# Patient Record
Sex: Female | Born: 1960 | Race: White | Hispanic: No
Health system: Southern US, Community
[De-identification: ages and names within clinical notes are randomized; demographics above are authoritative.]

---

## 1998-05-04 ENCOUNTER — Other Ambulatory Visit: Admission: RE | Admit: 1998-05-04 | Discharge: 1998-05-04 | Payer: Self-pay | Admitting: Obstetrics & Gynecology

## 1999-09-02 ENCOUNTER — Other Ambulatory Visit: Admission: RE | Admit: 1999-09-02 | Discharge: 1999-09-02 | Payer: Self-pay | Admitting: Obstetrics & Gynecology

## 2000-11-14 ENCOUNTER — Other Ambulatory Visit: Admission: RE | Admit: 2000-11-14 | Discharge: 2000-11-14 | Payer: Self-pay | Admitting: Obstetrics & Gynecology

## 2001-02-01 ENCOUNTER — Ambulatory Visit (HOSPITAL_COMMUNITY): Admission: RE | Admit: 2001-02-01 | Discharge: 2001-02-01 | Payer: Self-pay | Admitting: *Deleted

## 2001-02-01 ENCOUNTER — Encounter (INDEPENDENT_AMBULATORY_CARE_PROVIDER_SITE_OTHER): Payer: Self-pay | Admitting: Specialist

## 2002-01-10 ENCOUNTER — Other Ambulatory Visit: Admission: RE | Admit: 2002-01-10 | Discharge: 2002-01-10 | Payer: Self-pay | Admitting: Obstetrics & Gynecology

## 2003-01-16 ENCOUNTER — Other Ambulatory Visit: Admission: RE | Admit: 2003-01-16 | Discharge: 2003-01-16 | Payer: Self-pay | Admitting: Obstetrics & Gynecology

## 2004-03-16 ENCOUNTER — Other Ambulatory Visit: Admission: RE | Admit: 2004-03-16 | Discharge: 2004-03-16 | Payer: Self-pay | Admitting: Obstetrics & Gynecology

## 2005-04-13 ENCOUNTER — Other Ambulatory Visit: Admission: RE | Admit: 2005-04-13 | Discharge: 2005-04-13 | Payer: Self-pay | Admitting: Obstetrics & Gynecology

## 2007-05-10 ENCOUNTER — Encounter: Admission: RE | Admit: 2007-05-10 | Discharge: 2007-05-10 | Payer: Self-pay | Admitting: Obstetrics & Gynecology

## 2008-05-19 ENCOUNTER — Encounter: Admission: RE | Admit: 2008-05-19 | Discharge: 2008-05-19 | Payer: Self-pay | Admitting: Obstetrics & Gynecology

## 2009-05-20 ENCOUNTER — Encounter: Admission: RE | Admit: 2009-05-20 | Discharge: 2009-05-20 | Payer: Self-pay | Admitting: Obstetrics & Gynecology

## 2010-05-21 ENCOUNTER — Encounter
Admission: RE | Admit: 2010-05-21 | Discharge: 2010-05-21 | Payer: Self-pay | Source: Home / Self Care | Attending: Obstetrics & Gynecology | Admitting: Obstetrics & Gynecology

## 2011-04-20 ENCOUNTER — Other Ambulatory Visit: Payer: Self-pay | Admitting: Obstetrics & Gynecology

## 2011-04-20 DIAGNOSIS — Z1231 Encounter for screening mammogram for malignant neoplasm of breast: Secondary | ICD-10-CM

## 2011-05-25 ENCOUNTER — Ambulatory Visit
Admission: RE | Admit: 2011-05-25 | Discharge: 2011-05-25 | Disposition: A | Discharge status: Home / Self Care | Payer: BC Managed Care – PPO | Source: Ambulatory Visit | Attending: Obstetrics & Gynecology | Admitting: Obstetrics & Gynecology

## 2011-05-25 DIAGNOSIS — Z1231 Encounter for screening mammogram for malignant neoplasm of breast: Secondary | ICD-10-CM

## 2012-05-01 ENCOUNTER — Other Ambulatory Visit: Payer: Self-pay | Admitting: Obstetrics & Gynecology

## 2012-05-01 DIAGNOSIS — Z1231 Encounter for screening mammogram for malignant neoplasm of breast: Secondary | ICD-10-CM

## 2012-06-07 ENCOUNTER — Ambulatory Visit
Admission: RE | Admit: 2012-06-07 | Discharge: 2012-06-07 | Disposition: A | Discharge status: Home / Self Care | Payer: BC Managed Care – PPO | Source: Ambulatory Visit | Attending: Obstetrics & Gynecology | Admitting: Obstetrics & Gynecology

## 2012-06-07 DIAGNOSIS — Z1231 Encounter for screening mammogram for malignant neoplasm of breast: Secondary | ICD-10-CM

## 2012-06-12 ENCOUNTER — Other Ambulatory Visit: Payer: Self-pay | Admitting: Obstetrics & Gynecology

## 2012-06-12 DIAGNOSIS — R928 Other abnormal and inconclusive findings on diagnostic imaging of breast: Secondary | ICD-10-CM

## 2012-06-13 ENCOUNTER — Ambulatory Visit
Admission: RE | Admit: 2012-06-13 | Discharge: 2012-06-13 | Disposition: A | Discharge status: Home / Self Care | Payer: BC Managed Care – PPO | Source: Ambulatory Visit | Attending: Obstetrics & Gynecology | Admitting: Obstetrics & Gynecology

## 2012-06-13 DIAGNOSIS — R928 Other abnormal and inconclusive findings on diagnostic imaging of breast: Secondary | ICD-10-CM

## 2012-11-20 ENCOUNTER — Other Ambulatory Visit: Payer: Self-pay | Admitting: Obstetrics & Gynecology

## 2012-11-20 DIAGNOSIS — N6310 Unspecified lump in the right breast, unspecified quadrant: Secondary | ICD-10-CM

## 2012-12-12 ENCOUNTER — Ambulatory Visit
Admission: RE | Admit: 2012-12-12 | Discharge: 2012-12-12 | Disposition: A | Discharge status: Home / Self Care | Payer: BC Managed Care – PPO | Source: Ambulatory Visit | Attending: Obstetrics & Gynecology | Admitting: Obstetrics & Gynecology

## 2012-12-12 DIAGNOSIS — N6310 Unspecified lump in the right breast, unspecified quadrant: Secondary | ICD-10-CM

## 2013-05-07 ENCOUNTER — Other Ambulatory Visit: Payer: Self-pay | Admitting: Obstetrics & Gynecology

## 2013-05-07 DIAGNOSIS — N6310 Unspecified lump in the right breast, unspecified quadrant: Secondary | ICD-10-CM

## 2013-06-14 ENCOUNTER — Ambulatory Visit
Admission: RE | Admit: 2013-06-14 | Discharge: 2013-06-14 | Disposition: A | Discharge status: Home / Self Care | Payer: BC Managed Care – PPO | Source: Ambulatory Visit | Attending: Obstetrics & Gynecology | Admitting: Obstetrics & Gynecology

## 2013-06-14 DIAGNOSIS — N6310 Unspecified lump in the right breast, unspecified quadrant: Secondary | ICD-10-CM

## 2014-06-03 ENCOUNTER — Other Ambulatory Visit: Payer: Self-pay

## 2014-06-03 DIAGNOSIS — Z1231 Encounter for screening mammogram for malignant neoplasm of breast: Secondary | ICD-10-CM

## 2014-06-17 ENCOUNTER — Ambulatory Visit
Admission: RE | Admit: 2014-06-17 | Discharge: 2014-06-17 | Disposition: A | Discharge status: Home / Self Care | Payer: BLUE CROSS/BLUE SHIELD | Source: Ambulatory Visit

## 2014-06-17 DIAGNOSIS — Z1231 Encounter for screening mammogram for malignant neoplasm of breast: Secondary | ICD-10-CM

## 2014-06-18 ENCOUNTER — Other Ambulatory Visit: Payer: Self-pay | Admitting: Obstetrics & Gynecology

## 2014-06-18 DIAGNOSIS — R928 Other abnormal and inconclusive findings on diagnostic imaging of breast: Secondary | ICD-10-CM

## 2014-06-23 ENCOUNTER — Ambulatory Visit
Admission: RE | Admit: 2014-06-23 | Discharge: 2014-06-23 | Disposition: A | Discharge status: Home / Self Care | Payer: BLUE CROSS/BLUE SHIELD | Source: Ambulatory Visit | Attending: Obstetrics & Gynecology | Admitting: Obstetrics & Gynecology

## 2014-06-23 DIAGNOSIS — R928 Other abnormal and inconclusive findings on diagnostic imaging of breast: Secondary | ICD-10-CM

## 2014-06-25 ENCOUNTER — Other Ambulatory Visit: Payer: BLUE CROSS/BLUE SHIELD

## 2014-11-14 ENCOUNTER — Encounter (INDEPENDENT_AMBULATORY_CARE_PROVIDER_SITE_OTHER): Payer: BLUE CROSS/BLUE SHIELD

## 2014-11-14 DIAGNOSIS — R002 Palpitations: Secondary | ICD-10-CM | POA: Diagnosis not present

## 2014-11-19 ENCOUNTER — Telehealth: Payer: Self-pay | Admitting: Interventional Cardiology

## 2014-11-19 NOTE — Telephone Encounter (Signed)
Juliyana Nebel Maslanka  Holter Monitor 48hr HU and Amil Amen  Order# [id number]   Reading physician: Corky Crafts, MD  Ordering physician: Ileana Ladd, MD  Study date: 11/14/14         Patient Information     Name MRN Description    MEGANNE KUBA [id number] 54 year old Female        Study Information     Physician Technologist Supporting Staff     Katrina N Bowman              Vitals     Height Weight BMI (Calculated)              Interpretation Summary      NSR with PVCs and PACs. No sustained pathologic arrhythmias.   Spoke with pt and informed her of holter results. Pt verbalized understanding.

## 2014-11-21 ENCOUNTER — Other Ambulatory Visit: Payer: Self-pay | Admitting: Obstetrics & Gynecology

## 2014-11-21 DIAGNOSIS — N6489 Other specified disorders of breast: Secondary | ICD-10-CM

## 2014-11-25 ENCOUNTER — Other Ambulatory Visit: Payer: Self-pay | Admitting: Obstetrics & Gynecology

## 2014-11-25 DIAGNOSIS — R928 Other abnormal and inconclusive findings on diagnostic imaging of breast: Secondary | ICD-10-CM

## 2014-12-23 ENCOUNTER — Ambulatory Visit
Admission: RE | Admit: 2014-12-23 | Discharge: 2014-12-23 | Disposition: A | Discharge status: Home / Self Care | Payer: BLUE CROSS/BLUE SHIELD | Source: Ambulatory Visit | Attending: Obstetrics & Gynecology | Admitting: Obstetrics & Gynecology

## 2014-12-23 DIAGNOSIS — R928 Other abnormal and inconclusive findings on diagnostic imaging of breast: Secondary | ICD-10-CM

## 2014-12-23 DIAGNOSIS — N6489 Other specified disorders of breast: Secondary | ICD-10-CM

## 2015-06-03 ENCOUNTER — Other Ambulatory Visit: Payer: Self-pay

## 2015-06-03 DIAGNOSIS — Z1231 Encounter for screening mammogram for malignant neoplasm of breast: Secondary | ICD-10-CM

## 2015-06-25 ENCOUNTER — Ambulatory Visit
Admission: RE | Admit: 2015-06-25 | Discharge: 2015-06-25 | Disposition: A | Discharge status: Home / Self Care | Payer: BLUE CROSS/BLUE SHIELD | Source: Ambulatory Visit | Attending: Obstetrics & Gynecology | Admitting: Obstetrics & Gynecology

## 2015-06-25 ENCOUNTER — Other Ambulatory Visit: Payer: Self-pay | Admitting: Obstetrics & Gynecology

## 2015-06-25 ENCOUNTER — Ambulatory Visit
Admission: RE | Admit: 2015-06-25 | Discharge: 2015-06-25 | Disposition: A | Discharge status: Home / Self Care | Payer: BLUE CROSS/BLUE SHIELD | Source: Ambulatory Visit

## 2015-06-25 DIAGNOSIS — Z1231 Encounter for screening mammogram for malignant neoplasm of breast: Secondary | ICD-10-CM

## 2015-06-25 DIAGNOSIS — N644 Mastodynia: Secondary | ICD-10-CM

## 2015-09-22 DIAGNOSIS — Z23 Encounter for immunization: Secondary | ICD-10-CM | POA: Diagnosis not present

## 2015-09-22 DIAGNOSIS — Z13220 Encounter for screening for lipoid disorders: Secondary | ICD-10-CM | POA: Diagnosis not present

## 2015-09-22 DIAGNOSIS — Z0000 Encounter for general adult medical examination without abnormal findings: Secondary | ICD-10-CM | POA: Diagnosis not present

## 2015-09-22 DIAGNOSIS — D1801 Hemangioma of skin and subcutaneous tissue: Secondary | ICD-10-CM | POA: Diagnosis not present

## 2015-09-22 DIAGNOSIS — Z130 Encounter for screening for diseases of the blood and blood-forming organs and certain disorders involving the immune mechanism: Secondary | ICD-10-CM | POA: Diagnosis not present

## 2015-10-27 DIAGNOSIS — K5730 Diverticulosis of large intestine without perforation or abscess without bleeding: Secondary | ICD-10-CM | POA: Diagnosis not present

## 2015-10-27 DIAGNOSIS — Z1211 Encounter for screening for malignant neoplasm of colon: Secondary | ICD-10-CM | POA: Diagnosis not present

## 2015-10-27 DIAGNOSIS — D120 Benign neoplasm of cecum: Secondary | ICD-10-CM | POA: Diagnosis not present

## 2015-10-27 DIAGNOSIS — K635 Polyp of colon: Secondary | ICD-10-CM | POA: Diagnosis not present

## 2015-12-28 DIAGNOSIS — Z124 Encounter for screening for malignant neoplasm of cervix: Secondary | ICD-10-CM | POA: Diagnosis not present

## 2015-12-28 DIAGNOSIS — Z6827 Body mass index (BMI) 27.0-27.9, adult: Secondary | ICD-10-CM | POA: Diagnosis not present

## 2015-12-28 DIAGNOSIS — Z01419 Encounter for gynecological examination (general) (routine) without abnormal findings: Secondary | ICD-10-CM | POA: Diagnosis not present

## 2016-03-24 DIAGNOSIS — E7800 Pure hypercholesterolemia, unspecified: Secondary | ICD-10-CM | POA: Diagnosis not present

## 2016-03-24 DIAGNOSIS — F410 Panic disorder [episodic paroxysmal anxiety] without agoraphobia: Secondary | ICD-10-CM | POA: Diagnosis not present

## 2016-03-24 DIAGNOSIS — I10 Essential (primary) hypertension: Secondary | ICD-10-CM | POA: Diagnosis not present

## 2016-06-01 ENCOUNTER — Other Ambulatory Visit: Payer: Self-pay | Admitting: Family Medicine

## 2016-06-01 DIAGNOSIS — Z1231 Encounter for screening mammogram for malignant neoplasm of breast: Secondary | ICD-10-CM

## 2016-06-27 ENCOUNTER — Ambulatory Visit
Admission: RE | Admit: 2016-06-27 | Discharge: 2016-06-27 | Disposition: A | Discharge status: Home / Self Care | Payer: BLUE CROSS/BLUE SHIELD | Source: Ambulatory Visit | Attending: Family Medicine | Admitting: Family Medicine

## 2016-06-27 DIAGNOSIS — Z1231 Encounter for screening mammogram for malignant neoplasm of breast: Secondary | ICD-10-CM | POA: Diagnosis not present

## 2016-07-29 DIAGNOSIS — Z713 Dietary counseling and surveillance: Secondary | ICD-10-CM | POA: Diagnosis not present

## 2016-08-08 DIAGNOSIS — L249 Irritant contact dermatitis, unspecified cause: Secondary | ICD-10-CM | POA: Diagnosis not present

## 2016-08-19 DIAGNOSIS — Z713 Dietary counseling and surveillance: Secondary | ICD-10-CM | POA: Diagnosis not present

## 2016-09-16 DIAGNOSIS — Z713 Dietary counseling and surveillance: Secondary | ICD-10-CM | POA: Diagnosis not present

## 2016-09-26 DIAGNOSIS — Z130 Encounter for screening for diseases of the blood and blood-forming organs and certain disorders involving the immune mechanism: Secondary | ICD-10-CM | POA: Diagnosis not present

## 2016-09-26 DIAGNOSIS — Z13220 Encounter for screening for lipoid disorders: Secondary | ICD-10-CM | POA: Diagnosis not present

## 2016-09-26 DIAGNOSIS — Z0000 Encounter for general adult medical examination without abnormal findings: Secondary | ICD-10-CM | POA: Diagnosis not present

## 2016-10-27 DIAGNOSIS — D2271 Melanocytic nevi of right lower limb, including hip: Secondary | ICD-10-CM | POA: Diagnosis not present

## 2016-10-27 DIAGNOSIS — D1801 Hemangioma of skin and subcutaneous tissue: Secondary | ICD-10-CM | POA: Diagnosis not present

## 2016-10-27 DIAGNOSIS — D2272 Melanocytic nevi of left lower limb, including hip: Secondary | ICD-10-CM | POA: Diagnosis not present

## 2016-10-27 DIAGNOSIS — D225 Melanocytic nevi of trunk: Secondary | ICD-10-CM | POA: Diagnosis not present

## 2017-04-03 DIAGNOSIS — Z6826 Body mass index (BMI) 26.0-26.9, adult: Secondary | ICD-10-CM | POA: Diagnosis not present

## 2017-04-03 DIAGNOSIS — Z01419 Encounter for gynecological examination (general) (routine) without abnormal findings: Secondary | ICD-10-CM | POA: Diagnosis not present

## 2017-06-01 ENCOUNTER — Other Ambulatory Visit: Payer: Self-pay | Admitting: Family Medicine

## 2017-06-01 DIAGNOSIS — Z1231 Encounter for screening mammogram for malignant neoplasm of breast: Secondary | ICD-10-CM

## 2017-06-20 DIAGNOSIS — F411 Generalized anxiety disorder: Secondary | ICD-10-CM | POA: Diagnosis not present

## 2017-06-29 ENCOUNTER — Ambulatory Visit
Admission: RE | Admit: 2017-06-29 | Discharge: 2017-06-29 | Disposition: A | Discharge status: Home / Self Care | Payer: BLUE CROSS/BLUE SHIELD | Source: Ambulatory Visit | Attending: Family Medicine | Admitting: Family Medicine

## 2017-06-29 ENCOUNTER — Encounter: Payer: Self-pay | Admitting: Radiology

## 2017-06-29 DIAGNOSIS — Z1231 Encounter for screening mammogram for malignant neoplasm of breast: Secondary | ICD-10-CM

## 2017-10-02 DIAGNOSIS — R030 Elevated blood-pressure reading, without diagnosis of hypertension: Secondary | ICD-10-CM | POA: Diagnosis not present

## 2017-10-02 DIAGNOSIS — Z0000 Encounter for general adult medical examination without abnormal findings: Secondary | ICD-10-CM | POA: Diagnosis not present

## 2017-10-02 DIAGNOSIS — F410 Panic disorder [episodic paroxysmal anxiety] without agoraphobia: Secondary | ICD-10-CM | POA: Diagnosis not present

## 2017-10-02 DIAGNOSIS — Z13220 Encounter for screening for lipoid disorders: Secondary | ICD-10-CM | POA: Diagnosis not present

## 2017-10-02 DIAGNOSIS — Z130 Encounter for screening for diseases of the blood and blood-forming organs and certain disorders involving the immune mechanism: Secondary | ICD-10-CM | POA: Diagnosis not present

## 2017-10-10 DIAGNOSIS — S92504A Nondisplaced unspecified fracture of right lesser toe(s), initial encounter for closed fracture: Secondary | ICD-10-CM | POA: Diagnosis not present

## 2018-06-19 ENCOUNTER — Other Ambulatory Visit: Payer: Self-pay | Admitting: Family Medicine

## 2018-06-19 DIAGNOSIS — Z1231 Encounter for screening mammogram for malignant neoplasm of breast: Secondary | ICD-10-CM

## 2018-07-19 ENCOUNTER — Ambulatory Visit
Admission: RE | Admit: 2018-07-19 | Discharge: 2018-07-19 | Disposition: A | Discharge status: Home / Self Care | Payer: BLUE CROSS/BLUE SHIELD | Source: Ambulatory Visit | Attending: Family Medicine | Admitting: Family Medicine

## 2018-07-19 DIAGNOSIS — Z1231 Encounter for screening mammogram for malignant neoplasm of breast: Secondary | ICD-10-CM | POA: Diagnosis not present

## 2018-07-23 DIAGNOSIS — L986 Other infiltrative disorders of the skin and subcutaneous tissue: Secondary | ICD-10-CM | POA: Diagnosis not present

## 2018-07-23 DIAGNOSIS — L821 Other seborrheic keratosis: Secondary | ICD-10-CM | POA: Diagnosis not present

## 2018-07-23 DIAGNOSIS — D1801 Hemangioma of skin and subcutaneous tissue: Secondary | ICD-10-CM | POA: Diagnosis not present

## 2018-07-23 DIAGNOSIS — Z85828 Personal history of other malignant neoplasm of skin: Secondary | ICD-10-CM | POA: Diagnosis not present

## 2018-07-23 DIAGNOSIS — L570 Actinic keratosis: Secondary | ICD-10-CM | POA: Diagnosis not present

## 2018-07-23 DIAGNOSIS — D485 Neoplasm of uncertain behavior of skin: Secondary | ICD-10-CM | POA: Diagnosis not present

## 2018-07-23 DIAGNOSIS — D225 Melanocytic nevi of trunk: Secondary | ICD-10-CM | POA: Diagnosis not present

## 2019-03-19 DIAGNOSIS — Z0000 Encounter for general adult medical examination without abnormal findings: Secondary | ICD-10-CM | POA: Diagnosis not present

## 2019-03-19 DIAGNOSIS — E7800 Pure hypercholesterolemia, unspecified: Secondary | ICD-10-CM | POA: Diagnosis not present

## 2019-03-19 DIAGNOSIS — Z23 Encounter for immunization: Secondary | ICD-10-CM | POA: Diagnosis not present

## 2019-03-19 DIAGNOSIS — Z130 Encounter for screening for diseases of the blood and blood-forming organs and certain disorders involving the immune mechanism: Secondary | ICD-10-CM | POA: Diagnosis not present

## 2019-05-07 DIAGNOSIS — Z6831 Body mass index (BMI) 31.0-31.9, adult: Secondary | ICD-10-CM | POA: Diagnosis not present

## 2019-05-07 DIAGNOSIS — Z124 Encounter for screening for malignant neoplasm of cervix: Secondary | ICD-10-CM | POA: Diagnosis not present

## 2019-05-07 DIAGNOSIS — Z01419 Encounter for gynecological examination (general) (routine) without abnormal findings: Secondary | ICD-10-CM | POA: Diagnosis not present

## 2019-06-10 ENCOUNTER — Other Ambulatory Visit: Payer: Self-pay | Admitting: Family Medicine

## 2019-06-10 DIAGNOSIS — Z1231 Encounter for screening mammogram for malignant neoplasm of breast: Secondary | ICD-10-CM

## 2019-07-05 DIAGNOSIS — Z23 Encounter for immunization: Secondary | ICD-10-CM | POA: Diagnosis not present

## 2019-07-23 ENCOUNTER — Ambulatory Visit
Admission: RE | Admit: 2019-07-23 | Discharge: 2019-07-23 | Disposition: A | Discharge status: Home / Self Care | Payer: BC Managed Care – PPO | Source: Ambulatory Visit | Attending: Family Medicine | Admitting: Family Medicine

## 2019-07-23 ENCOUNTER — Other Ambulatory Visit: Payer: Self-pay

## 2019-07-23 DIAGNOSIS — Z1231 Encounter for screening mammogram for malignant neoplasm of breast: Secondary | ICD-10-CM

## 2019-07-29 DIAGNOSIS — L821 Other seborrheic keratosis: Secondary | ICD-10-CM | POA: Diagnosis not present

## 2019-07-29 DIAGNOSIS — L814 Other melanin hyperpigmentation: Secondary | ICD-10-CM | POA: Diagnosis not present

## 2019-07-29 DIAGNOSIS — D2262 Melanocytic nevi of left upper limb, including shoulder: Secondary | ICD-10-CM | POA: Diagnosis not present

## 2019-07-29 DIAGNOSIS — D1801 Hemangioma of skin and subcutaneous tissue: Secondary | ICD-10-CM | POA: Diagnosis not present

## 2019-11-07 DIAGNOSIS — R309 Painful micturition, unspecified: Secondary | ICD-10-CM | POA: Diagnosis not present

## 2020-03-31 DIAGNOSIS — Z0000 Encounter for general adult medical examination without abnormal findings: Secondary | ICD-10-CM | POA: Diagnosis not present

## 2020-03-31 DIAGNOSIS — E7800 Pure hypercholesterolemia, unspecified: Secondary | ICD-10-CM | POA: Diagnosis not present

## 2020-03-31 DIAGNOSIS — Z130 Encounter for screening for diseases of the blood and blood-forming organs and certain disorders involving the immune mechanism: Secondary | ICD-10-CM | POA: Diagnosis not present

## 2020-05-19 DIAGNOSIS — R7401 Elevation of levels of liver transaminase levels: Secondary | ICD-10-CM | POA: Diagnosis not present

## 2020-07-14 ENCOUNTER — Other Ambulatory Visit: Payer: Self-pay | Admitting: Family Medicine

## 2020-07-14 DIAGNOSIS — Z1231 Encounter for screening mammogram for malignant neoplasm of breast: Secondary | ICD-10-CM

## 2020-07-27 DIAGNOSIS — D225 Melanocytic nevi of trunk: Secondary | ICD-10-CM | POA: Diagnosis not present

## 2020-07-27 DIAGNOSIS — L814 Other melanin hyperpigmentation: Secondary | ICD-10-CM | POA: Diagnosis not present

## 2020-07-27 DIAGNOSIS — Z85828 Personal history of other malignant neoplasm of skin: Secondary | ICD-10-CM | POA: Diagnosis not present

## 2020-07-27 DIAGNOSIS — L821 Other seborrheic keratosis: Secondary | ICD-10-CM | POA: Diagnosis not present

## 2020-09-02 ENCOUNTER — Other Ambulatory Visit: Payer: Self-pay

## 2020-09-02 ENCOUNTER — Ambulatory Visit
Admission: RE | Admit: 2020-09-02 | Discharge: 2020-09-02 | Disposition: A | Discharge status: Home / Self Care | Payer: BC Managed Care – PPO | Source: Ambulatory Visit | Attending: Family Medicine | Admitting: Family Medicine

## 2020-09-02 DIAGNOSIS — Z1231 Encounter for screening mammogram for malignant neoplasm of breast: Secondary | ICD-10-CM

## 2021-04-12 DIAGNOSIS — R7401 Elevation of levels of liver transaminase levels: Secondary | ICD-10-CM | POA: Diagnosis not present

## 2021-04-12 DIAGNOSIS — F411 Generalized anxiety disorder: Secondary | ICD-10-CM | POA: Diagnosis not present

## 2021-04-12 DIAGNOSIS — E7800 Pure hypercholesterolemia, unspecified: Secondary | ICD-10-CM | POA: Diagnosis not present

## 2021-04-12 DIAGNOSIS — Z130 Encounter for screening for diseases of the blood and blood-forming organs and certain disorders involving the immune mechanism: Secondary | ICD-10-CM | POA: Diagnosis not present

## 2021-04-12 DIAGNOSIS — Z0000 Encounter for general adult medical examination without abnormal findings: Secondary | ICD-10-CM | POA: Diagnosis not present

## 2021-04-29 DIAGNOSIS — R7401 Elevation of levels of liver transaminase levels: Secondary | ICD-10-CM | POA: Diagnosis not present

## 2021-05-06 ENCOUNTER — Other Ambulatory Visit: Payer: Self-pay | Admitting: Family Medicine

## 2021-05-06 DIAGNOSIS — R7401 Elevation of levels of liver transaminase levels: Secondary | ICD-10-CM

## 2021-05-10 DIAGNOSIS — Z124 Encounter for screening for malignant neoplasm of cervix: Secondary | ICD-10-CM | POA: Diagnosis not present

## 2021-05-10 DIAGNOSIS — Z6831 Body mass index (BMI) 31.0-31.9, adult: Secondary | ICD-10-CM | POA: Diagnosis not present

## 2021-05-10 DIAGNOSIS — Z01419 Encounter for gynecological examination (general) (routine) without abnormal findings: Secondary | ICD-10-CM | POA: Diagnosis not present

## 2021-05-11 ENCOUNTER — Ambulatory Visit
Admission: RE | Admit: 2021-05-11 | Discharge: 2021-05-11 | Disposition: A | Discharge status: Home / Self Care | Payer: BC Managed Care – PPO | Source: Ambulatory Visit | Attending: Family Medicine | Admitting: Family Medicine

## 2021-05-11 DIAGNOSIS — K7689 Other specified diseases of liver: Secondary | ICD-10-CM | POA: Diagnosis not present

## 2021-05-11 DIAGNOSIS — R945 Abnormal results of liver function studies: Secondary | ICD-10-CM | POA: Diagnosis not present

## 2021-05-11 DIAGNOSIS — R7401 Elevation of levels of liver transaminase levels: Secondary | ICD-10-CM

## 2021-07-27 DIAGNOSIS — D1801 Hemangioma of skin and subcutaneous tissue: Secondary | ICD-10-CM | POA: Diagnosis not present

## 2021-07-27 DIAGNOSIS — D2272 Melanocytic nevi of left lower limb, including hip: Secondary | ICD-10-CM | POA: Diagnosis not present

## 2021-07-27 DIAGNOSIS — L814 Other melanin hyperpigmentation: Secondary | ICD-10-CM | POA: Diagnosis not present

## 2021-07-27 DIAGNOSIS — L821 Other seborrheic keratosis: Secondary | ICD-10-CM | POA: Diagnosis not present

## 2021-09-13 ENCOUNTER — Other Ambulatory Visit: Payer: Self-pay | Admitting: Family Medicine

## 2021-09-13 DIAGNOSIS — Z1231 Encounter for screening mammogram for malignant neoplasm of breast: Secondary | ICD-10-CM

## 2021-09-14 ENCOUNTER — Ambulatory Visit
Admission: RE | Admit: 2021-09-14 | Discharge: 2021-09-14 | Disposition: A | Discharge status: Home / Self Care | Payer: BC Managed Care – PPO | Source: Ambulatory Visit | Attending: Family Medicine | Admitting: Family Medicine

## 2021-09-14 DIAGNOSIS — Z1231 Encounter for screening mammogram for malignant neoplasm of breast: Secondary | ICD-10-CM | POA: Diagnosis not present

## 2022-03-31 DIAGNOSIS — Z23 Encounter for immunization: Secondary | ICD-10-CM | POA: Diagnosis not present

## 2022-04-26 DIAGNOSIS — Z0000 Encounter for general adult medical examination without abnormal findings: Secondary | ICD-10-CM | POA: Diagnosis not present

## 2022-04-26 DIAGNOSIS — F411 Generalized anxiety disorder: Secondary | ICD-10-CM | POA: Diagnosis not present

## 2022-04-26 DIAGNOSIS — E7800 Pure hypercholesterolemia, unspecified: Secondary | ICD-10-CM | POA: Diagnosis not present

## 2022-07-13 DIAGNOSIS — Z01419 Encounter for gynecological examination (general) (routine) without abnormal findings: Secondary | ICD-10-CM | POA: Diagnosis not present

## 2022-07-13 DIAGNOSIS — Z6832 Body mass index (BMI) 32.0-32.9, adult: Secondary | ICD-10-CM | POA: Diagnosis not present

## 2022-07-27 DIAGNOSIS — L570 Actinic keratosis: Secondary | ICD-10-CM | POA: Diagnosis not present

## 2022-07-27 DIAGNOSIS — L814 Other melanin hyperpigmentation: Secondary | ICD-10-CM | POA: Diagnosis not present

## 2022-07-27 DIAGNOSIS — D225 Melanocytic nevi of trunk: Secondary | ICD-10-CM | POA: Diagnosis not present

## 2022-07-27 DIAGNOSIS — D2272 Melanocytic nevi of left lower limb, including hip: Secondary | ICD-10-CM | POA: Diagnosis not present

## 2022-07-27 DIAGNOSIS — L821 Other seborrheic keratosis: Secondary | ICD-10-CM | POA: Diagnosis not present

## 2022-09-29 ENCOUNTER — Other Ambulatory Visit: Payer: Self-pay | Admitting: Obstetrics & Gynecology

## 2022-09-29 DIAGNOSIS — Z1231 Encounter for screening mammogram for malignant neoplasm of breast: Secondary | ICD-10-CM

## 2022-09-30 ENCOUNTER — Ambulatory Visit
Admission: RE | Admit: 2022-09-30 | Discharge: 2022-09-30 | Disposition: A | Discharge status: Home / Self Care | Payer: BC Managed Care – PPO | Source: Ambulatory Visit | Attending: Obstetrics & Gynecology | Admitting: Obstetrics & Gynecology

## 2022-09-30 DIAGNOSIS — Z1231 Encounter for screening mammogram for malignant neoplasm of breast: Secondary | ICD-10-CM | POA: Diagnosis not present

## 2023-01-14 IMAGING — MG MM DIGITAL SCREENING BILAT W/ TOMO AND CAD
8 series · 8 of 24 positions shown · non-contrast
Comparison: Previous exam(s).

CLINICAL DATA: Screening.

EXAM:
DIGITAL SCREENING BILATERAL MAMMOGRAM WITH TOMOSYNTHESIS AND CAD
TECHNIQUE: Bilateral screening digital craniocaudal and mediolateral oblique
mammograms were obtained. Bilateral screening digital breast
tomosynthesis was performed. The images were evaluated with
computer-aided detection.

[L CC synth-2D]
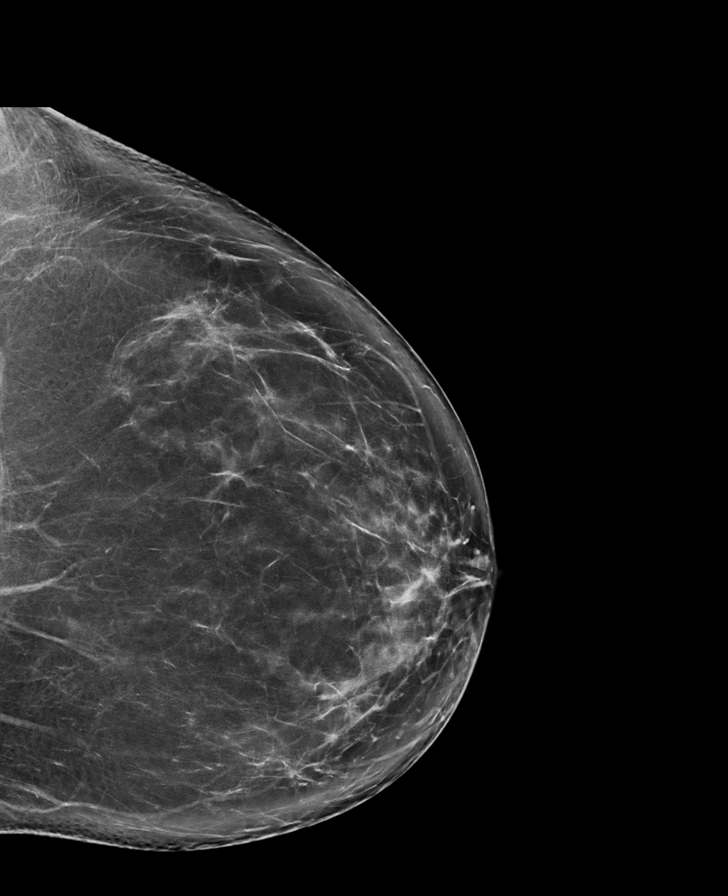

[R CC synth-2D]
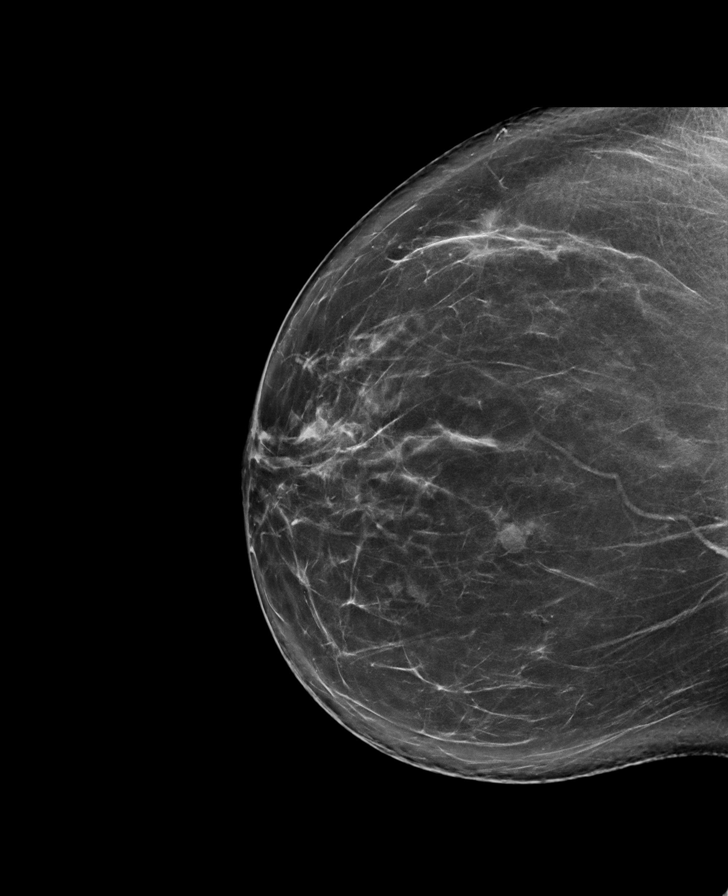

[R MLO synth-2D]
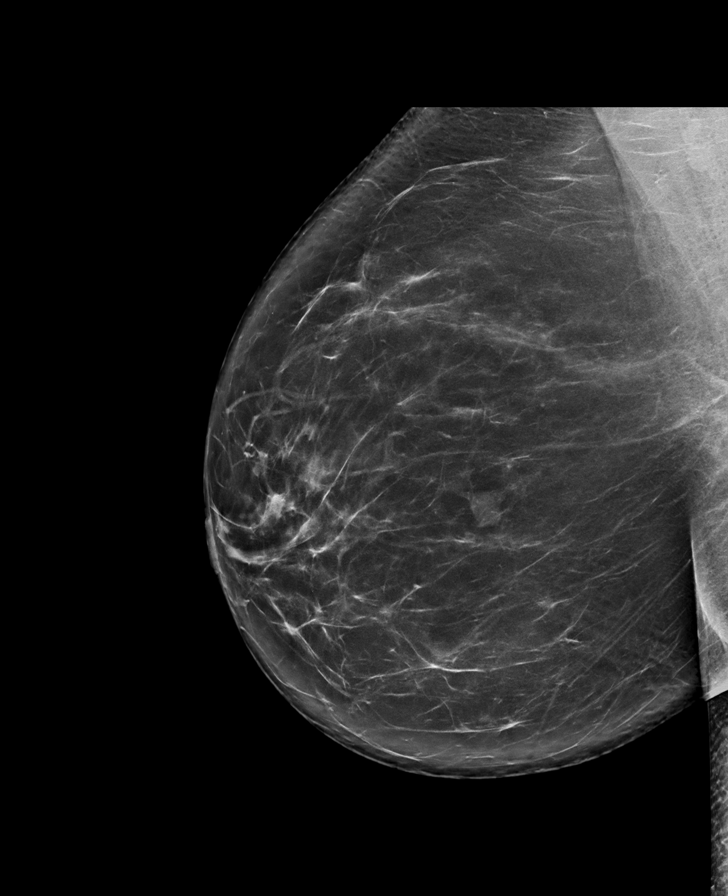

[L MLO synth-2D]
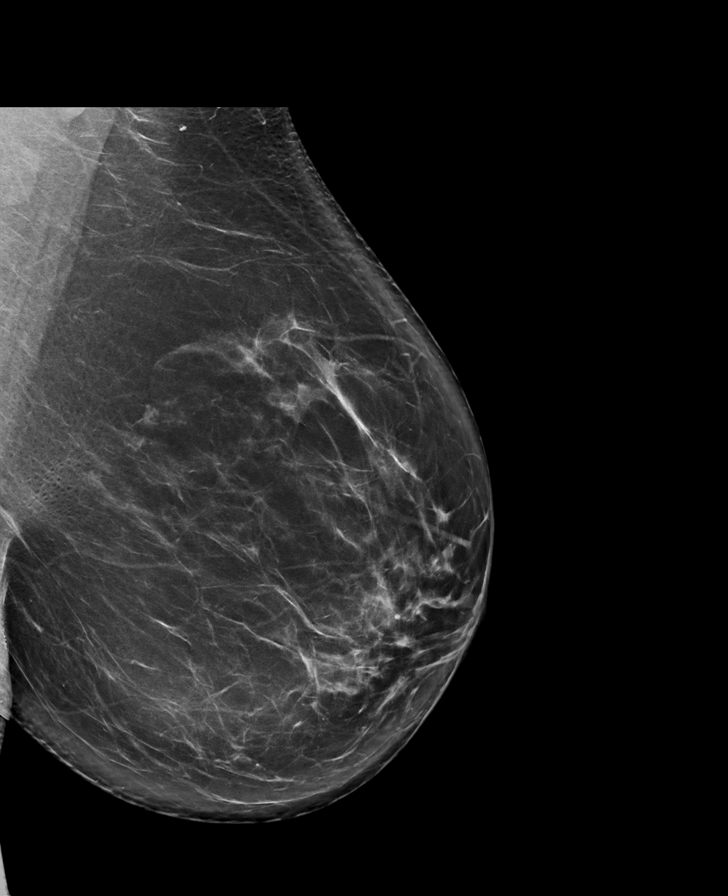

[L CC tomo · tomo slice 45/88.0]
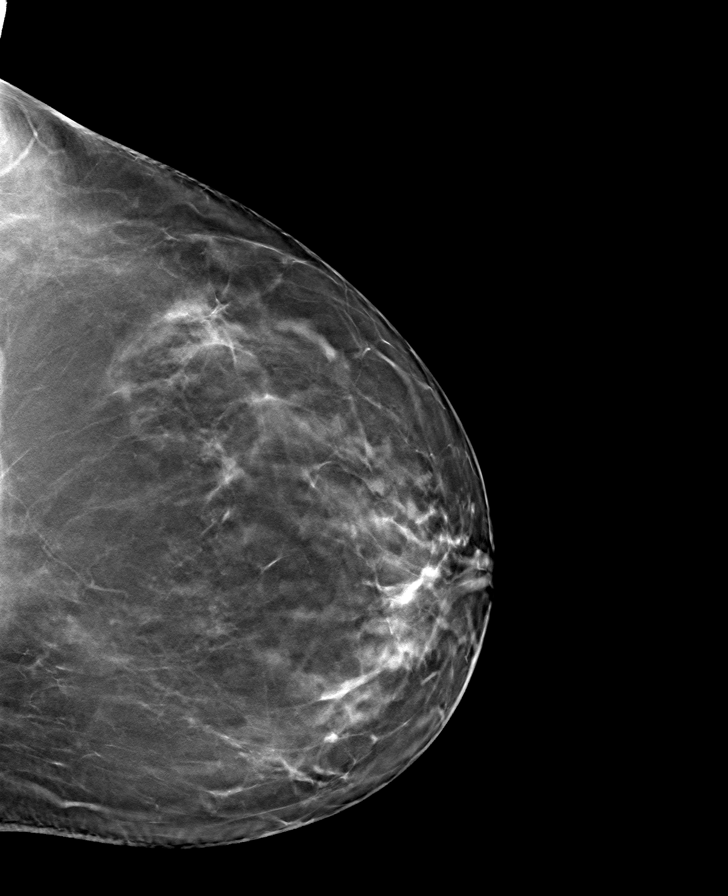

[R MLO tomo · tomo slice 49/96.0]
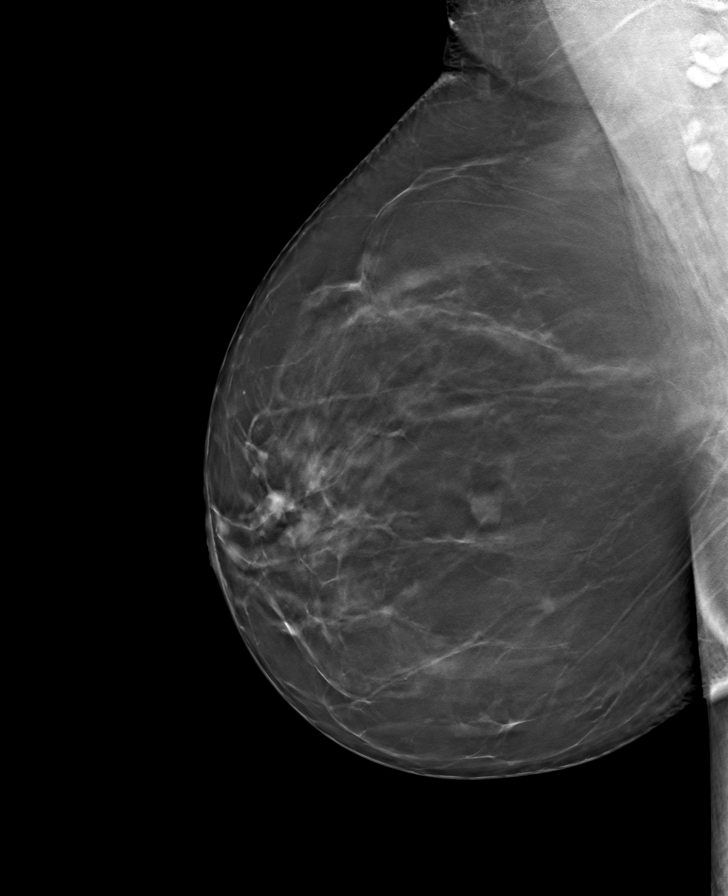

[L MLO tomo · tomo slice 49/97.0]
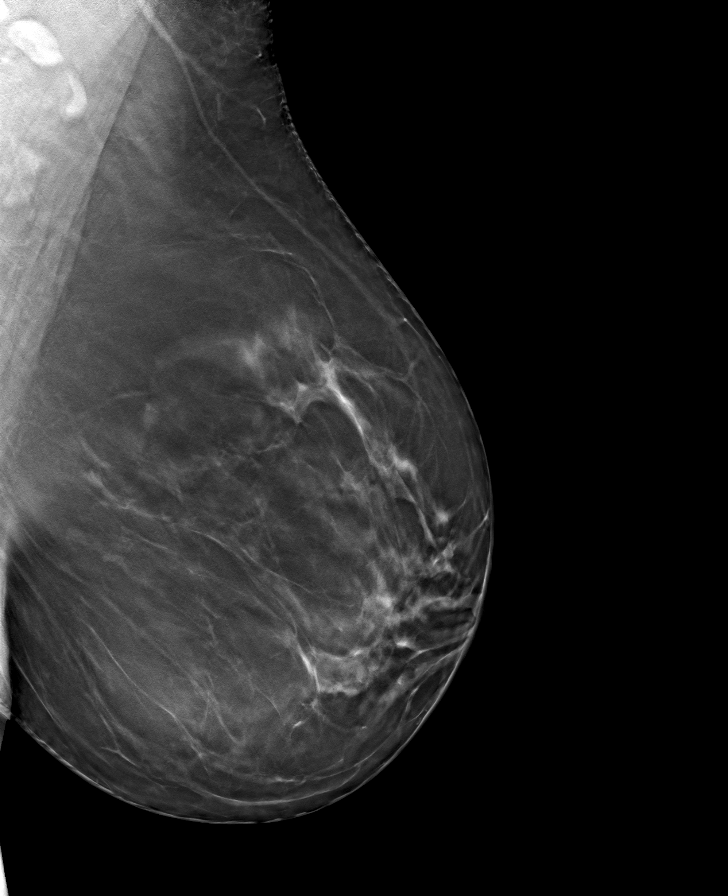

[R CC tomo · tomo slice 45/89.0]
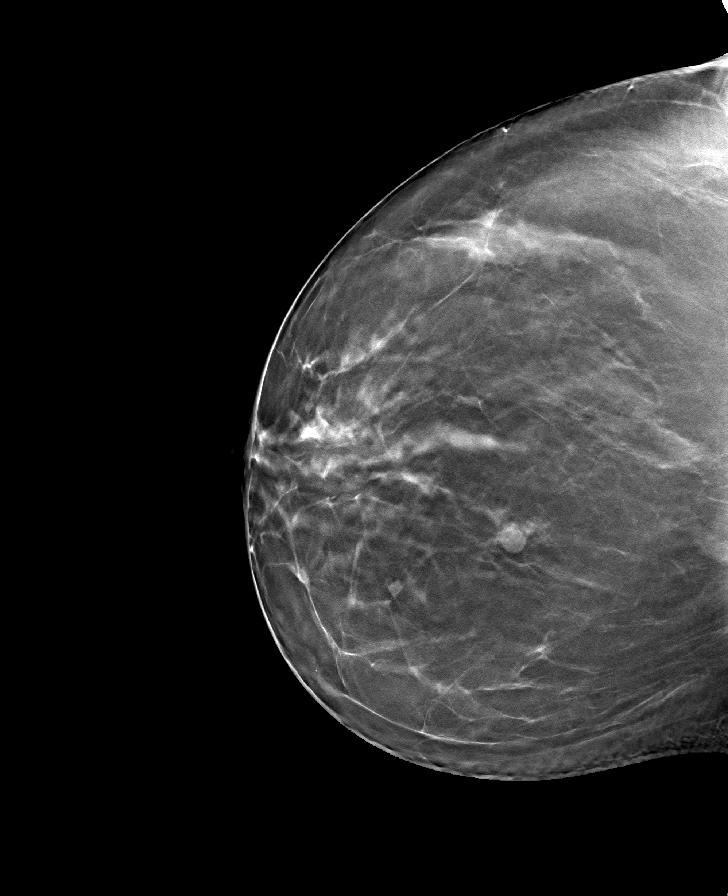

[8 of 24 positions shown; findings below may reference images not displayed]

ACR Breast Density Category b: There are scattered areas of
fibroglandular density.
FINDINGS: There are no findings suspicious for malignancy.
IMPRESSION: No mammographic evidence of malignancy. A result letter of this
screening mammogram will be mailed directly to the patient.

RECOMMENDATION:
Screening mammogram in one year. (Code:51-O-LD2)

BI-RADS CATEGORY  1: Negative.

## 2023-04-06 DIAGNOSIS — Z23 Encounter for immunization: Secondary | ICD-10-CM | POA: Diagnosis not present

## 2023-06-27 ENCOUNTER — Other Ambulatory Visit (HOSPITAL_BASED_OUTPATIENT_CLINIC_OR_DEPARTMENT_OTHER): Payer: Self-pay | Admitting: Family Medicine

## 2023-06-27 DIAGNOSIS — R7401 Elevation of levels of liver transaminase levels: Secondary | ICD-10-CM | POA: Diagnosis not present

## 2023-06-27 DIAGNOSIS — F411 Generalized anxiety disorder: Secondary | ICD-10-CM | POA: Diagnosis not present

## 2023-06-27 DIAGNOSIS — E7800 Pure hypercholesterolemia, unspecified: Secondary | ICD-10-CM

## 2023-06-27 DIAGNOSIS — Z0000 Encounter for general adult medical examination without abnormal findings: Secondary | ICD-10-CM | POA: Diagnosis not present

## 2023-06-30 ENCOUNTER — Ambulatory Visit (HOSPITAL_COMMUNITY)
Admission: RE | Admit: 2023-06-30 | Discharge: 2023-06-30 | Disposition: A | Discharge status: Home / Self Care | Payer: Self-pay | Source: Ambulatory Visit | Attending: Family Medicine

## 2023-06-30 DIAGNOSIS — Z136 Encounter for screening for cardiovascular disorders: Secondary | ICD-10-CM | POA: Diagnosis not present

## 2023-06-30 DIAGNOSIS — E7800 Pure hypercholesterolemia, unspecified: Secondary | ICD-10-CM | POA: Insufficient documentation

## 2023-07-31 DIAGNOSIS — L821 Other seborrheic keratosis: Secondary | ICD-10-CM | POA: Diagnosis not present

## 2023-07-31 DIAGNOSIS — D2261 Melanocytic nevi of right upper limb, including shoulder: Secondary | ICD-10-CM | POA: Diagnosis not present

## 2023-07-31 DIAGNOSIS — D225 Melanocytic nevi of trunk: Secondary | ICD-10-CM | POA: Diagnosis not present

## 2023-07-31 DIAGNOSIS — L814 Other melanin hyperpigmentation: Secondary | ICD-10-CM | POA: Diagnosis not present

## 2023-08-02 DIAGNOSIS — Z01419 Encounter for gynecological examination (general) (routine) without abnormal findings: Secondary | ICD-10-CM | POA: Diagnosis not present

## 2023-08-02 DIAGNOSIS — Z124 Encounter for screening for malignant neoplasm of cervix: Secondary | ICD-10-CM | POA: Diagnosis not present

## 2023-09-08 ENCOUNTER — Other Ambulatory Visit: Payer: Self-pay | Admitting: Obstetrics & Gynecology

## 2023-09-08 DIAGNOSIS — Z1231 Encounter for screening mammogram for malignant neoplasm of breast: Secondary | ICD-10-CM

## 2023-10-04 ENCOUNTER — Ambulatory Visit
Admission: RE | Admit: 2023-10-04 | Discharge: 2023-10-04 | Disposition: A | Discharge status: Home / Self Care | Source: Ambulatory Visit | Attending: Obstetrics & Gynecology | Admitting: Obstetrics & Gynecology

## 2023-10-04 DIAGNOSIS — Z1231 Encounter for screening mammogram for malignant neoplasm of breast: Secondary | ICD-10-CM | POA: Diagnosis not present

## 2023-12-27 DIAGNOSIS — F411 Generalized anxiety disorder: Secondary | ICD-10-CM | POA: Diagnosis not present

## 2023-12-27 DIAGNOSIS — Z6832 Body mass index (BMI) 32.0-32.9, adult: Secondary | ICD-10-CM | POA: Diagnosis not present

## 2024-04-04 DIAGNOSIS — Z23 Encounter for immunization: Secondary | ICD-10-CM | POA: Diagnosis not present
